# Patient Record
Sex: Male | Born: 1975 | Race: White | Hispanic: No | Marital: Single | Smoking: Current every day smoker
Health system: Southern US, Community
[De-identification: ages and names within clinical notes are randomized; demographics above are authoritative.]

## PROBLEM LIST (undated history)

## (undated) DIAGNOSIS — F319 Bipolar disorder, unspecified: Secondary | ICD-10-CM

## (undated) DIAGNOSIS — J45909 Unspecified asthma, uncomplicated: Secondary | ICD-10-CM

## (undated) DIAGNOSIS — C801 Malignant (primary) neoplasm, unspecified: Secondary | ICD-10-CM

## (undated) DIAGNOSIS — F1010 Alcohol abuse, uncomplicated: Secondary | ICD-10-CM

## (undated) DIAGNOSIS — R569 Unspecified convulsions: Secondary | ICD-10-CM

## (undated) HISTORY — PX: ANKLE FRACTURE SURGERY: SHX122

## (undated) HISTORY — PX: NOSE SURGERY: SHX723

---

## 2018-05-18 ENCOUNTER — Emergency Department (HOSPITAL_COMMUNITY)
Admission: EM | Admit: 2018-05-18 | Discharge: 2018-05-18 | Discharge status: Against Medical Advice | Payer: Self-pay | Attending: Emergency Medicine | Admitting: Emergency Medicine

## 2018-05-18 ENCOUNTER — Other Ambulatory Visit: Payer: Self-pay

## 2018-05-18 ENCOUNTER — Encounter (HOSPITAL_COMMUNITY): Payer: Self-pay | Admitting: *Deleted

## 2018-05-18 DIAGNOSIS — Y908 Blood alcohol level of 240 mg/100 ml or more: Secondary | ICD-10-CM | POA: Insufficient documentation

## 2018-05-18 DIAGNOSIS — F10920 Alcohol use, unspecified with intoxication, uncomplicated: Secondary | ICD-10-CM

## 2018-05-18 DIAGNOSIS — Z5321 Procedure and treatment not carried out due to patient leaving prior to being seen by health care provider: Secondary | ICD-10-CM | POA: Insufficient documentation

## 2018-05-18 DIAGNOSIS — F10120 Alcohol abuse with intoxication, uncomplicated: Secondary | ICD-10-CM | POA: Insufficient documentation

## 2018-05-18 HISTORY — DX: Unspecified asthma, uncomplicated: J45.909

## 2018-05-18 HISTORY — DX: Bipolar disorder, unspecified: F31.9

## 2018-05-18 LAB — SALICYLATE LEVEL: Salicylate Lvl: <7 mg/dL (ref 2.8–30.0)

## 2018-05-18 LAB — COMPREHENSIVE METABOLIC PANEL
ALT: 311 U/L — ABNORMAL HIGH (ref 0–44)
ANION GAP: 12 (ref 5–15)
AST: 194 U/L — AB (ref 15–41)
Albumin: 3.8 g/dL (ref 3.5–5.0)
Alkaline Phosphatase: 80 U/L (ref 38–126)
BILIRUBIN TOTAL: 0.4 mg/dL (ref 0.3–1.2)
BUN: 7 mg/dL (ref 6–20)
CO2: 30 mmol/L (ref 22–32)
Calcium: 9.2 mg/dL (ref 8.9–10.3)
Chloride: 102 mmol/L (ref 98–111)
Creatinine, Ser: 0.77 mg/dL (ref 0.61–1.24)
Glucose, Bld: 105 mg/dL — ABNORMAL HIGH (ref 70–99)
POTASSIUM: 2.5 mmol/L — AB (ref 3.5–5.1)
Sodium: 144 mmol/L (ref 135–145)
TOTAL PROTEIN: 7.1 g/dL (ref 6.5–8.1)

## 2018-05-18 LAB — ACETAMINOPHEN LEVEL: Acetaminophen (Tylenol), Serum: <10 ug/mL — ABNORMAL LOW (ref 10–30)

## 2018-05-18 LAB — CBC
HEMATOCRIT: 47.3 % (ref 39.0–52.0)
Hemoglobin: 16 g/dL (ref 13.0–17.0)
MCH: 31.2 pg (ref 26.0–34.0)
MCHC: 33.8 g/dL (ref 30.0–36.0)
MCV: 92.2 fL (ref 78.0–100.0)
Platelets: 162 10*3/uL (ref 150–400)
RBC: 5.13 MIL/uL (ref 4.22–5.81)
RDW: 14.5 % (ref 11.5–15.5)
WBC: 9.4 10*3/uL (ref 4.0–10.5)

## 2018-05-18 LAB — ETHANOL: ALCOHOL ETHYL (B): 402 mg/dL — AB (ref <10)

## 2018-05-18 MED ORDER — LORAZEPAM 2 MG/ML IJ SOLN: 0.0000 mg | Freq: Four times a day (QID) | INTRAMUSCULAR | Status: DC

## 2018-05-18 MED ORDER — LORAZEPAM 2 MG/ML IJ SOLN: 0.0000 mg | Freq: Two times a day (BID) | INTRAMUSCULAR | Status: DC

## 2018-05-18 MED ORDER — ALBUTEROL SULFATE (2.5 MG/3ML) 0.083% IN NEBU: 5.0000 mg | INHALATION_SOLUTION | Freq: Once | RESPIRATORY_TRACT | Status: DC

## 2018-05-18 MED ORDER — THIAMINE HCL 100 MG/ML IJ SOLN: 100.0000 mg | Freq: Every day | INTRAMUSCULAR | Status: DC

## 2018-05-18 MED ORDER — VITAMIN B-1 100 MG PO TABS: 100.0000 mg | ORAL_TABLET | Freq: Every day | ORAL | Status: DC

## 2018-05-18 MED ORDER — LORAZEPAM 1 MG PO TABS: 0.0000 mg | ORAL_TABLET | Freq: Four times a day (QID) | ORAL | Status: DC

## 2018-05-18 MED ORDER — LORAZEPAM 1 MG PO TABS: 0.0000 mg | ORAL_TABLET | Freq: Two times a day (BID) | ORAL | Status: DC

## 2018-05-18 NOTE — ED Triage Notes (Signed)
Pt states he was trying to kill himself with alcohol.  States found out his wife had been cheating on him a 8 months ago and he just couldn't take it anymore.  Has not been taking his trazadone and geodon for his bi-polar.  Pt appears to be intoxicated.

## 2018-05-18 NOTE — ED Provider Notes (Cosign Needed Addendum)
Patient placed in Quick Look pathway, seen and evaluated   Chief Complaint: Alcohol intoxication, suicidal ideation  HPI: Patient with history of bipolar disorder presents with complaint of suicidality, intoxication.  Patient has been drinking heavily over the past week to do stressors.  He has been off of his psych medications occluding Geodon and Trileptal.  He states that these typically make him feel better.  He complains of some abdominal pain and diarrhea.  No vomiting.  He states that he is trying to drink himself to death.  ROS:  Positive ROS: (+) Slurred speech, abdominal pain Negative ROS: (-) Fever, vomiting  Physical Exam:   Gen: No distress, grossly intoxicated  Neuro: Awake and Alert  Skin: Warm    Focused Exam: Heart tachycardia, nml S1,S2, no m/r/g; Lungs scattered wheezing; Abd soft, NT, no rebound or guarding; Ext 2+ pedal pulses bilaterally, no edema; neuro slurred speech; psych active suicidal ideation  BP 115/65 (BP Location: Right Arm)   Pulse (!) 108   Temp 98.7 F (37.1 C) (Oral)   Resp 18   SpO2 96%   Plan: We will check medical screening labs.  Patient will need TTS eval when intoxication improved.  Initiation of care has begun. The patient has been counseled on the process, plan, and necessity for staying for the completion/evaluation, and the remainder of the medical screening examination  Addend: Pt decided he is going to leave. He has no discreet plan of suicide and his main issue currently is that he is intoxicated. He voices that he wants help with alcohol but isn't willing to stay. No indications to IVC patient at current time.     Carlisle Cater, PA-C 05/18/18 1544    Carlisle Cater, PA-C 05/18/18 1654

## 2018-05-18 NOTE — ED Notes (Signed)
Lab called this RN with critical lab result of Alcohol level 402, Potassium level 2.5. Pt left ama prior to this call. Provider aware.

## 2018-05-18 NOTE — ED Notes (Signed)
Pt walked out of the room and stated he is leaving.  Attempted to have pt stay and pt refused.  Unable to physically restrain pt d/t fact that pt is not ivc'd.  PA is at pt side and is aware.

## 2018-06-15 ENCOUNTER — Emergency Department (HOSPITAL_COMMUNITY)
Admission: EM | Admit: 2018-06-15 | Discharge: 2018-06-16 | Disposition: A | Discharge status: Home / Self Care | Payer: Medicare Other | Attending: Emergency Medicine | Admitting: Emergency Medicine

## 2018-06-15 ENCOUNTER — Other Ambulatory Visit: Payer: Self-pay

## 2018-06-15 ENCOUNTER — Encounter (HOSPITAL_COMMUNITY): Payer: Self-pay

## 2018-06-15 DIAGNOSIS — R45851 Suicidal ideations: Secondary | ICD-10-CM

## 2018-06-15 DIAGNOSIS — F1014 Alcohol abuse with alcohol-induced mood disorder: Secondary | ICD-10-CM | POA: Insufficient documentation

## 2018-06-15 DIAGNOSIS — Y908 Blood alcohol level of 240 mg/100 ml or more: Secondary | ICD-10-CM | POA: Diagnosis not present

## 2018-06-15 DIAGNOSIS — F1010 Alcohol abuse, uncomplicated: Secondary | ICD-10-CM

## 2018-06-15 HISTORY — DX: Malignant (primary) neoplasm, unspecified: C80.1

## 2018-06-15 HISTORY — DX: Unspecified convulsions: R56.9

## 2018-06-15 HISTORY — DX: Alcohol abuse, uncomplicated: F10.10

## 2018-06-15 LAB — CBC
HEMATOCRIT: 48.5 % (ref 39.0–52.0)
HEMOGLOBIN: 17.1 g/dL — AB (ref 13.0–17.0)
MCH: 32.8 pg (ref 26.0–34.0)
MCHC: 35.3 g/dL (ref 30.0–36.0)
MCV: 93.1 fL (ref 78.0–100.0)
Platelets: 183 10*3/uL (ref 150–400)
RBC: 5.21 MIL/uL (ref 4.22–5.81)
RDW: 15 % (ref 11.5–15.5)
WBC: 9.1 10*3/uL (ref 4.0–10.5)

## 2018-06-15 LAB — COMPREHENSIVE METABOLIC PANEL
ALK PHOS: 92 U/L (ref 38–126)
ALT: 48 U/L — AB (ref 0–44)
AST: 61 U/L — AB (ref 15–41)
Albumin: 4.1 g/dL (ref 3.5–5.0)
Anion gap: 13 (ref 5–15)
BILIRUBIN TOTAL: 0.5 mg/dL (ref 0.3–1.2)
BUN: 6 mg/dL (ref 6–20)
CO2: 26 mmol/L (ref 22–32)
CREATININE: 0.86 mg/dL (ref 0.61–1.24)
Calcium: 8.9 mg/dL (ref 8.9–10.3)
Chloride: 104 mmol/L (ref 98–111)
GFR calc Af Amer: >60 mL/min (ref >60)
Glucose, Bld: 114 mg/dL — ABNORMAL HIGH (ref 70–99)
Potassium: 3.5 mmol/L (ref 3.5–5.1)
Sodium: 143 mmol/L (ref 135–145)
TOTAL PROTEIN: 7.7 g/dL (ref 6.5–8.1)

## 2018-06-15 LAB — ETHANOL: Alcohol, Ethyl (B): 310 mg/dL (ref <10)

## 2018-06-15 LAB — RAPID URINE DRUG SCREEN, HOSP PERFORMED
AMPHETAMINES: NOT DETECTED
BARBITURATES: NOT DETECTED
Benzodiazepines: NOT DETECTED
COCAINE: POSITIVE — AB
Opiates: NOT DETECTED
TETRAHYDROCANNABINOL: NOT DETECTED

## 2018-06-15 LAB — SALICYLATE LEVEL: Salicylate Lvl: <7 mg/dL (ref 2.8–30.0)

## 2018-06-15 LAB — ACETAMINOPHEN LEVEL: Acetaminophen (Tylenol), Serum: <10 ug/mL — ABNORMAL LOW (ref 10–30)

## 2018-06-15 NOTE — Progress Notes (Signed)
PT REFUSED TO SPEAK WITH THIS WRITER. UPON ENTERING THE ROOM TOLD TTS TO GET OUT AND THAT HE DID NOT FEEL LIKE TALKING BECAUSE HE HAS ANSWERED QUESTIONS 5 TIMES TODAY.   Lind Covert, MSW, LCSW Therapeutic Triage Specialist  [phone]

## 2018-06-15 NOTE — ED Provider Notes (Signed)
[facility] DEPT Provider Note   CSN: [id number] Arrival date & time: 06/15/18  1451     History   Chief Complaint Chief Complaint  Patient presents with  . Suicidal  . intoxicated  . detox    HPI Ronald Dickson is a 42 y.o. male.  42 yo M with a chief complaint of suicidal ideation.  The patient decided that he was going to may be harm himself by jumping off a bridge.  He went out drinking today.  He denies any recent change in things at home just feels overwhelmed.  He denies any medical complaint denies chest pain shortness of breath abdominal pain vomiting diarrhea.  The history is provided by the patient.  Illness  This is a new problem. The current episode started yesterday. The problem occurs constantly. The problem has not changed since onset.Pertinent negatives include no chest pain, no abdominal pain, no headaches and no shortness of breath. Nothing aggravates the symptoms. Nothing relieves the symptoms. He has tried nothing for the symptoms. The treatment provided no relief.    Past Medical History:  Diagnosis Date  . Alcohol abuse   . Asthma   . Bipolar 1 disorder ([name])   . Cancer ([name])   . Seizures ([name])     There are no active problems to display for this patient.   Past Surgical History:  Procedure Laterality Date  . ANKLE FRACTURE SURGERY    . NOSE SURGERY          Home Medications    Prior to Admission medications   Medication Sig Start Date End Date Taking? Authorizing Provider  Oxcarbazepine (TRILEPTAL) 300 MG tablet Take 300 mg by mouth 2 (two) times daily.   Yes [provider]  traZODone (DESYREL) 100 MG tablet Take 300 mg by mouth at bedtime.   Yes [provider]  ziprasidone (GEODON) 40 MG capsule Take 40 mg by mouth 2 (two) times daily with a meal.   Yes [provider]    Family History No family history on file.  Social History Social History   Tobacco Use  . Smoking  status: Current Every Day Smoker    Packs/day: 1.00    Types: Cigarettes  . Smokeless tobacco: Never Used  Substance Use Topics  . Alcohol use: Yes    Comment: daily- asmuch as I can. I want to stay passed out.  . Drug use: Yes    Types: Cocaine    Comment: Pataient states he has been using as much crack as possible     Allergies   Sulfa antibiotics   Review of Systems Review of Systems  Constitutional: Negative for chills and fever.  HENT: Negative for congestion and facial swelling.   Eyes: Negative for discharge and visual disturbance.  Respiratory: Negative for shortness of breath.   Cardiovascular: Negative for chest pain and palpitations.  Gastrointestinal: Negative for abdominal pain, diarrhea and vomiting.  Musculoskeletal: Negative for arthralgias and myalgias.  Skin: Negative for color change and rash.  Neurological: Negative for tremors, syncope and headaches.  Psychiatric/Behavioral: Positive for suicidal ideas. Negative for confusion and dysphoric mood.     Physical Exam Updated Vital Signs BP 114/84 (BP Location: Right Arm)   Pulse (!) 111   Temp 98.2 F (36.8 C) (Oral)   Resp 16   Ht 5\' 8"  (1.727 m)   Wt 83.9 kg   SpO2 94%   BMI 28.13 kg/m   Physical Exam  Constitutional: He  is oriented to person, place, and time. He appears well-developed and well-nourished.  HENT:  Head: Normocephalic and atraumatic.  Eyes: Pupils are equal, round, and reactive to light. EOM are normal.  Neck: Normal range of motion. Neck supple. No JVD present.  Cardiovascular: Normal rate and regular rhythm. Exam reveals no gallop and no friction rub.  No murmur heard. Pulmonary/Chest: No respiratory distress. He has no wheezes.  Abdominal: He exhibits no distension. There is no rebound and no guarding.  Musculoskeletal: Normal range of motion.  Neurological: He is alert and oriented to person, place, and time.  Skin: No rash noted. No pallor.  Psychiatric: He has a normal  mood and affect. His behavior is normal.  Nursing note and vitals reviewed.    ED Treatments / Results  Labs (all labs ordered are listed, but only abnormal results are displayed) Labs Reviewed  COMPREHENSIVE METABOLIC PANEL - Abnormal; Notable for the following components:      Result Value   Glucose, Bld 114 (*)    AST 61 (*)    ALT 48 (*)    All other components within normal limits  ETHANOL - Abnormal; Notable for the following components:   Alcohol, Ethyl (B) 310 (*)    All other components within normal limits  ACETAMINOPHEN LEVEL - Abnormal; Notable for the following components:   Acetaminophen (Tylenol), Serum <10 (*)    All other components within normal limits  CBC - Abnormal; Notable for the following components:   Hemoglobin 17.1 (*)    All other components within normal limits  RAPID URINE DRUG SCREEN, HOSP PERFORMED - Abnormal; Notable for the following components:   Cocaine POSITIVE (*)    All other components within normal limits  SALICYLATE LEVEL    EKG None  Radiology No results found.  Procedures Procedures (including critical care time)  Medications Ordered in ED Medications - No data to display   Initial Impression / Assessment and Plan / ED Course  I have reviewed the triage vital signs and the nursing notes.  Pertinent labs & imaging results that were available during my care of the patient were reviewed by me and considered in my medical decision making (see chart for details).     42 yo M with a chief complaint of suicidal ideation.  I feel he is medically clear.  TTS evaluation.  Patient refusing TTS eval, will likely be seen in morning.   The patients results and plan were reviewed and discussed.   Any x-rays performed were independently reviewed by myself.   Differential diagnosis were considered with the presenting HPI.  Medications - No data to display  Vitals:   06/15/18 1519 06/15/18 1540 06/15/18 1819 06/15/18 2132  BP:  133/89 (!) 114/54 128/68 114/84  Pulse: 97 67 83 (!) 111  Resp:  16 18 16   Temp:  98.2 F (36.8 C) 98.1 F (36.7 C) 98.2 F (36.8 C)  TempSrc:  Oral Oral Oral  SpO2:  96% 93% 94%  Weight:      Height:        Final diagnoses:  Suicidal ideation  Alcohol abuse      Final Clinical Impressions(s) / ED Diagnoses   Final diagnoses:  Suicidal ideation  Alcohol abuse    ED Discharge Orders    None       Deno Etienne, DO 06/15/18 2235

## 2018-06-15 NOTE — ED Notes (Signed)
BAC reported as 75  Dr Tyrone Nine made aware

## 2018-06-15 NOTE — ED Triage Notes (Addendum)
Patient was brought in by [employer]. Patient c/o feeling suicidal with a plan to jump off of a bridge. Patient has been drinking alcohol. Patient states he has been off of his meds x 2 [place].   Patient takes Geodon 40 mg bid, Trileptal 300 mg tid, and Trazadone 350 mg every hs  Patient states he is from [place].  Patient states he drinks as much as possible to stay passed out and states he uses as much crack as possbile. Patient states he has seizures when he goes through detox

## 2018-06-15 NOTE — ED Notes (Signed)
Pharmacy at bedside getting medication history.

## 2018-06-15 NOTE — ED Notes (Addendum)
Patient moved from room 26 to 41.  Alert and oriented but drowsy.  Patient admits he has not taken his medication for several [place] and has been drinking ETOH and doing cocaine.  He says he has a diagnosis of bipolar.  When asked if he is suicidal, patient replied he doesn't know what he is right now.  Patient oriented to the unit, advised of 15 minute checks, and video monitoring.  Patient in no acute distress.  Reports he knows the medications he is supposed to be taking and started to list them off to this Probation officer.  Pharmacy called to obtain prior medication list.

## 2018-06-15 NOTE — Progress Notes (Signed)
Patient was asleep and unresponsive when TTS went to assess. Will assess when he wakes up.

## 2018-06-15 NOTE — ED Notes (Signed)
Bed: [facility] Expected date:  Expected time:  Means of arrival:  Comments: 

## 2018-06-16 DIAGNOSIS — F1014 Alcohol abuse with alcohol-induced mood disorder: Secondary | ICD-10-CM

## 2018-06-16 MED ORDER — TRAZODONE HCL 50 MG PO TABS: 50.0000 mg | ORAL_TABLET | Freq: Every evening | ORAL | Status: DC | PRN

## 2018-06-16 MED ORDER — OXCARBAZEPINE 300 MG PO TABS: 300.0000 mg | ORAL_TABLET | Freq: Two times a day (BID) | ORAL | Status: DC

## 2018-06-16 MED ORDER — OXCARBAZEPINE 300 MG PO TABS
300.0000 mg | ORAL_TABLET | Freq: Two times a day (BID) | ORAL | Status: DC
  Administered 2018-06-16: 300 mg via ORAL
  Filled 2018-06-16: qty 1

## 2018-06-16 NOTE — Patient Outreach (Signed)
[facility] met with the patient to provide help with substance use recovery support and help with recovery resources. Patient states that the only reason he has been drinking alcohol and using cocaine is to help with the symptoms of his Bi-polar disorder and not having access to medications. Information for [facility] will be provided in discharge instructions to help with medications. Patient does not want help with substance use treatment placement at this time. [facility]PSS still provided [facility] contact information for the patient. [facility] strongly encouraged the patient to follow up with [facility] for recovery support and help with recovery resources after discharge from the [facility] if needed.

## 2018-06-16 NOTE — Discharge Instructions (Signed)
For your Mental Needs, please follow up at:   [facility] [address], [place] [address] [phone]   For assistance with homeless shelters and identification needs, please follow up at:  [facility] [place], [place] [address] [phone]
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
## 2018-06-16 NOTE — Progress Notes (Signed)
Per Patriciaann Clan, PA pt meets criteria for inpt treatment. EDP Nehemiah Settle, PA and pt's nurse Bethena Roys, RN has been advised. TTS to seek placement.  Lind Covert, MSW, LCSW Therapeutic Triage Specialist  [phone]

## 2018-06-16 NOTE — BH Assessment (Addendum)
Assessment Note  Ronald Dickson is an 42 y.o. male who presents to the ED voluntarily. Pt initially refused to engage with TTS and stated in an angry tone "I'm tired of answering questions. They keep asking me the same stuff." TTS asked the pt to identify the stressors leading him to feel suicidal and pt stated "I already told them that." After much probing, pt eventually states that he recently moved here from [place] and he does not have an ID. Pt states he has the potential to work making $40 an hour but since he does not have an ID, he's not able to work. Pt states he has been to the [facility] and other places for help with obtaining his ID but he needs a birth certificate. Pt states he cannot afford to purchase his birth certificate, therefore he's not able to get his ID. Pt reported during triage that he has a plan to jump off of a bridge. Pt's current BAL is 310 and he is also positive for cocaine. Pt states if he does not get back on his medication he is going to kill himself.   Per Patriciaann Clan, PA pt meets criteria for inpt treatment. EDP Nehemiah Settle, PA and pt's nurse Bethena Roys, RN has been advised. TTS to seek placement.  Diagnosis: Substance induced mood disorder; Alcohol use disorder, severe; Cocaine use disorder, severe  Past Medical History:  Past Medical History:  Diagnosis Date  . Alcohol abuse   . Asthma   . Bipolar 1 disorder ([place])   . Cancer ([place])   . Seizures ([place])     Past Surgical History:  Procedure Laterality Date  . ANKLE FRACTURE SURGERY    . NOSE SURGERY      Family History: No family history on file.  Social History:  reports that he has been smoking cigarettes. He has been smoking about 1.00 pack per day. He has never used smokeless tobacco. He reports that he drinks alcohol. He reports that he has current or past drug history. Drug: Cocaine.  Additional Social History:  Alcohol / Drug Use Pain Medications: See MAR Prescriptions: See MAR Over the Counter: See  MAR History of alcohol / drug use?: Yes Substance #1 Name of Substance 1: Alcohol 1 - Age of First Use: unknown 1 - Amount (size/oz): BAL 310 1 - Frequency: unknown 1 - Duration: ongoing 1 - Last Use / Amount: 06/15/18 Substance #2 Name of Substance 2: Cocaine 2 - Age of First Use: unknown 2 - Amount (size/oz): varies 2 - Frequency: unknown 2 - Duration: ongoing 2 - Last Use / Amount: labs positive on arrival to ED  CIWA: CIWA-Ar BP: 135/82 Pulse Rate: 94 Nausea and Vomiting: 3 Tactile Disturbances: mild itching, pins and needles, burning or numbness Tremor: no tremor Auditory Disturbances: not present Paroxysmal Sweats: no sweat visible Visual Disturbances: not present Anxiety: moderately anxious, or guarded, so anxiety is inferred Headache, Fullness in Head: moderate Agitation: two Orientation and Clouding of Sensorium: oriented and can do serial additions CIWA-Ar Total: 14 COWS:    Allergies:  Allergies  Allergen Reactions  . Sulfa Antibiotics     Home Medications:  (Not in a hospital admission)  OB/GYN Status:  No LMP for male patient.  General Assessment Data Assessment unable to be completed: Yes Reason for not completing assessment: PT REFUSED TO SPEAK WITH THIS WRITER. UPON ENTERING THE ROOM TOLD TTS TO GET OUT AND THAT HE DID NOT FEEL LIKE TALKING BECAUSE HE HAS ANSWERED QUESTIONS 5 TIMES  TODAY.  [facility] TTS Assessment: In system Is this a Tele or Face-to-Face Assessment?: Face-to-Face Is this an Initial Assessment or a Re-assessment for this encounter?: Initial Assessment Patient Accompanied by:: (alone) Language Other than English: No Living Arrangements: Homeless/Shelter What gender do you identify as?: Male Marital status: (UTA) Pregnancy Status: No Living Arrangements: Alone Can pt return to current living arrangement?: Yes Admission Status: Voluntary Is patient capable of signing voluntary admission?: Yes Referral  Source: Self/Family/Friend Insurance type: NONE     Crisis Care Plan Living Arrangements: Alone Name of Psychiatrist: [place] Name of Therapist: UTA  Education Status Is patient currently in school?: No Is the patient employed, unemployed or receiving disability?: Unemployed  Risk to self with the past 6 months Suicidal Ideation: Yes-Currently Present Has patient been a risk to self within the past 6 months prior to admission? : Yes Suicidal Intent: No Has patient had any suicidal intent within the past 6 months prior to admission? : No Is patient at risk for suicide?: Yes Suicidal Plan?: Yes-Currently Present Has patient had any suicidal plan within the past 6 months prior to admission? : Yes Specify Current Suicidal Plan: PT TOLD TRIAGE THAT HE HAS A PLAN TO JUMP OFF OF A BRIDGE  Access to Means: Yes Specify Access to Suicidal Means: PT HAS ACCESS TO [place]  What has been your use of drugs/alcohol within the last 12 months?: ALCOHOL, COCAINE Previous Attempts/Gestures: (UTA) Triggers for Past Attempts: Unknown Intentional Self Injurious Behavior: None Family Suicide History: No Recent stressful life event(s): Job Loss, Financial Problems, Other (Comment)(MOVED TO [place], NO ID, SUBSTANCE ABUSE ) Persecutory voices/beliefs?: No Depression: Yes Depression Symptoms: Feeling angry/irritable Substance abuse history and/or treatment for substance abuse?: Yes Suicide prevention information given to non-admitted patients: Not applicable  Risk to Others within the past 6 months Homicidal Ideation: No Does patient have any lifetime risk of violence toward others beyond the six months prior to admission? : No Thoughts of Harm to Others: No Current Homicidal Intent: No Current Homicidal Plan: No Access to Homicidal Means: No History of harm to others?: No Assessment of Violence: None Noted Does patient have access to weapons?: (UTA) Criminal Charges Pending?: (UTA) Does patient have a  court date: (UTA) Is patient on probation?: Unknown  Psychosis Hallucinations: (UTA) Delusions: (UTA)  Mental Status Report Appearance/Hygiene: Disheveled, In scrubs Eye Contact: Poor Motor Activity: Freedom of movement Speech: Aggressive Level of Consciousness: Irritable Mood: Angry Affect: Angry, Irritable Anxiety Level: None Thought Processes: Coherent, Relevant Judgement: Impaired Orientation: Person, Place, Time Obsessive Compulsive Thoughts/Behaviors: None  Cognitive Functioning Concentration: Normal Memory: Remote Intact, Recent Intact Is patient IDD: No Insight: Poor Impulse Control: Poor Appetite: Good Have you had any weight changes? : No Change Sleep: Unable to Assess Vegetative Symptoms: None  ADLScreening [facility] Assessment Services) Patient's cognitive ability adequate to safely complete daily activities?: Yes Patient able to express need for assistance with ADLs?: Yes Independently performs ADLs?: Yes (appropriate for developmental age)  Prior Inpatient Therapy Prior Inpatient Therapy: (UNKNOWN)  Prior Outpatient Therapy Prior Outpatient Therapy: (UNKNOWN) Does patient have an ACCT team?: No Does patient have Intensive In-House Services?  : No Does patient have [facility] services? : No Does patient have [facility] services?: No  ADL Screening (condition at time of admission) Patient's cognitive ability adequate to safely complete daily activities?: Yes Is the patient deaf or have difficulty hearing?: No Does the patient have difficulty seeing, even when wearing glasses/contacts?: No Does the patient have  difficulty concentrating, remembering, or making decisions?: No Patient able to express need for assistance with ADLs?: Yes Does the patient have difficulty dressing or bathing?: No Independently performs ADLs?: Yes (appropriate for developmental age) Does the patient have difficulty walking or climbing stairs?: No Weakness of Legs: None Weakness of  Arms/Hands: None  Home Assistive Devices/Equipment Home Assistive Devices/Equipment: None    Abuse/Neglect Assessment (Assessment to be complete while patient is alone) Abuse/Neglect Assessment Can Be Completed: Unable to assess, patient is non-responsive or altered mental status     Advance Directives (For Healthcare) Does Patient Have a Medical Advance Directive?: No Would patient like information on creating a medical advance directive?: No - Patient declined          Disposition: Per Patriciaann Clan, PA pt meets criteria for inpt treatment. EDP Nehemiah Settle, PA and pt's nurse Bethena Roys, RN has been advised. TTS to seek placement.  Disposition Initial Assessment Completed for this Encounter: Yes Disposition of Patient: Admit Type of inpatient treatment program: Adult(per Patriciaann Clan, PA) Patient refused recommended treatment: No  On Site Evaluation by:   Reviewed with Physician:    Lyanne Co 06/16/2018 6:17 AM

## 2018-06-21 ENCOUNTER — Encounter (HOSPITAL_COMMUNITY): Payer: Self-pay

## 2018-06-21 ENCOUNTER — Emergency Department (HOSPITAL_COMMUNITY)
Admission: EM | Admit: 2018-06-21 | Discharge: 2018-06-21 | Disposition: A | Discharge status: Home / Self Care | Payer: Medicare Other | Attending: Emergency Medicine | Admitting: Emergency Medicine

## 2018-06-21 ENCOUNTER — Other Ambulatory Visit: Payer: Self-pay

## 2018-06-21 DIAGNOSIS — Z79899 Other long term (current) drug therapy: Secondary | ICD-10-CM | POA: Diagnosis not present

## 2018-06-21 DIAGNOSIS — F250 Schizoaffective disorder, bipolar type: Secondary | ICD-10-CM | POA: Insufficient documentation

## 2018-06-21 DIAGNOSIS — F1010 Alcohol abuse, uncomplicated: Secondary | ICD-10-CM

## 2018-06-21 DIAGNOSIS — R45851 Suicidal ideations: Secondary | ICD-10-CM | POA: Diagnosis not present

## 2018-06-21 DIAGNOSIS — F1490 Cocaine use, unspecified, uncomplicated: Secondary | ICD-10-CM | POA: Insufficient documentation

## 2018-06-21 DIAGNOSIS — Y907 Blood alcohol level of 200-239 mg/100 ml: Secondary | ICD-10-CM | POA: Diagnosis not present

## 2018-06-21 DIAGNOSIS — F10920 Alcohol use, unspecified with intoxication, uncomplicated: Secondary | ICD-10-CM | POA: Diagnosis not present

## 2018-06-21 LAB — CBC
HEMATOCRIT: 46.8 % (ref 39.0–52.0)
Hemoglobin: 16 g/dL (ref 13.0–17.0)
MCH: 32.4 pg (ref 26.0–34.0)
MCHC: 34.2 g/dL (ref 30.0–36.0)
MCV: 94.7 fL (ref 78.0–100.0)
PLATELETS: 178 10*3/uL (ref 150–400)
RBC: 4.94 MIL/uL (ref 4.22–5.81)
RDW: 14.6 % (ref 11.5–15.5)
WBC: 5.2 10*3/uL (ref 4.0–10.5)

## 2018-06-21 LAB — COMPREHENSIVE METABOLIC PANEL
ALK PHOS: 77 U/L (ref 38–126)
ALT: 115 U/L — AB (ref 0–44)
AST: 104 U/L — AB (ref 15–41)
Albumin: 3.7 g/dL (ref 3.5–5.0)
Anion gap: 14 (ref 5–15)
BUN: 7 mg/dL (ref 6–20)
CALCIUM: 8.8 mg/dL — AB (ref 8.9–10.3)
CHLORIDE: 106 mmol/L (ref 98–111)
CO2: 26 mmol/L (ref 22–32)
Creatinine, Ser: 0.71 mg/dL (ref 0.61–1.24)
Glucose, Bld: 110 mg/dL — ABNORMAL HIGH (ref 70–99)
Potassium: 3.3 mmol/L — ABNORMAL LOW (ref 3.5–5.1)
Sodium: 146 mmol/L — ABNORMAL HIGH (ref 135–145)
TOTAL PROTEIN: 7.3 g/dL (ref 6.5–8.1)
Total Bilirubin: 0.3 mg/dL (ref 0.3–1.2)

## 2018-06-21 LAB — SALICYLATE LEVEL

## 2018-06-21 LAB — ETHANOL: Alcohol, Ethyl (B): 237 mg/dL — ABNORMAL HIGH (ref <10)

## 2018-06-21 LAB — ACETAMINOPHEN LEVEL

## 2018-06-21 NOTE — ED Provider Notes (Signed)
[facility] DEPT Provider Note   CSN: [id number] Arrival date & time: 06/21/18  0125     History   Chief Complaint Chief Complaint  Patient presents with  . Suicidal  . Alcohol Intoxication    HPI Ronald Dickson is a 42 y.o. male.  The history is provided by the patient and medical records. No language interpreter was used.  Alcohol Intoxication    Ronald Dickson is a 42 y.o. male  with a PMH of bipolar disorder, seizures, asthma, etoh abuse who presents to the Emergency Department for suicidal thoughts.  Per nursing staff, he told multiple people when checking and that he wanted to blow his brains out.  During my evaluation, I awakened him from sleep.  He yelled several profanities and told me to leave him alone.  I asked him several times while he was in the emergency department today and he kept telling me about how he needs an ID card and needs help with getting his ID card figured out.  I asked him if he had thoughts about wanting to hurt himself and he said "well you just woke me up and I haven't had time to get two thoughts together." I asked him again later in the interview and he told me to quit bothering him and asking so many questions.    Past Medical History:  Diagnosis Date  . Alcohol abuse   . Asthma   . Bipolar 1 disorder ([place])   . Cancer ([place])   . Seizures [facility])     Patient Active Problem List   Diagnosis Date Noted  . Alcohol abuse with alcohol-induced mood disorder ([place]) 06/16/2018    Past Surgical History:  Procedure Laterality Date  . ANKLE FRACTURE SURGERY    . NOSE SURGERY          Home Medications    Prior to Admission medications   Medication Sig Start Date End Date Taking? Authorizing Provider  Oxcarbazepine (TRILEPTAL) 300 MG tablet Take 300 mg by mouth 2 (two) times daily.   Yes [provider]  traZODone (DESYREL) 100 MG tablet Take 300 mg by mouth at bedtime.   Yes [provider]    ziprasidone (GEODON) 40 MG capsule Take 40 mg by mouth 2 (two) times daily with a meal.   Yes [provider]    Family History History reviewed. No pertinent family history.  Social History Social History   Tobacco Use  . Smoking status: Current Every Day Smoker    Packs/day: 1.00    Types: Cigarettes  . Smokeless tobacco: Never Used  Substance Use Topics  . Alcohol use: Yes    Comment: daily- asmuch as I can. I want to stay passed out.  . Drug use: Yes    Types: Cocaine    Comment: Pataient states he has been using as much crack as possible     Allergies   Sulfa antibiotics   Review of Systems Review of Systems  Reason unable to perform ROS: Patient cooperation.  Psychiatric/Behavioral: Positive for suicidal ideas.     Physical Exam Updated Vital Signs BP (!) 94/53 (BP Location: Right Arm)   Pulse 83   Resp 16   SpO2 96%   Physical Exam  Constitutional: He is oriented to person, place, and time. He appears well-developed and well-nourished. No distress.  HENT:  Head: Normocephalic and atraumatic.  Neck: Neck supple.  Cardiovascular: Normal rate, regular rhythm and normal heart sounds.  No  murmur heard. Pulmonary/Chest: Effort normal and breath sounds normal. No respiratory distress.  Musculoskeletal: Normal range of motion.  Neurological: He is alert and oriented to person, place, and time.  Skin: Skin is warm and dry.  Nursing note and vitals reviewed.    ED Treatments / Results  Labs (all labs ordered are listed, but only abnormal results are displayed) Labs Reviewed  COMPREHENSIVE METABOLIC PANEL - Abnormal; Notable for the following components:      Result Value   Sodium 146 (*)    Potassium 3.3 (*)    Glucose, Bld 110 (*)    Calcium 8.8 (*)    AST 104 (*)    ALT 115 (*)    All other components within normal limits  ETHANOL - Abnormal; Notable for the following components:   Alcohol, Ethyl (B) 237 (*)    All other components  within normal limits  ACETAMINOPHEN LEVEL - Abnormal; Notable for the following components:   Acetaminophen (Tylenol), Serum <10 (*)    All other components within normal limits  SALICYLATE LEVEL  CBC  RAPID URINE DRUG SCREEN, HOSP PERFORMED    EKG None  Radiology No results found.  Procedures Procedures (including critical care time)  Medications Ordered in ED Medications - No data to display   Initial Impression / Assessment and Plan / ED Course  I have reviewed the triage vital signs and the nursing notes.  Pertinent labs & imaging results that were available during my care of the patient were reviewed by me and considered in my medical decision making (see chart for details).    Ronald Dickson is a 42 y.o. male who presents to ED after telling several people that he wanted to blow his brains out. He denies any SI to me, stating that he needs to get an ID in the morning and needs a place to stay until he can get this sorted out. Labs reviewed with ETOH of 237. Baseline liver enzymes. Given SI in triage, TTS was consulted who does not feel that patient meets criteria. Recommends discharge. Patient discharged in satisfactory condition.    Final Clinical Impressions(s) / ED Diagnoses   Final diagnoses:  Alcohol abuse    ED Discharge Orders    None       Ward, Ozella Almond, PA-C 06/21/18 1749    Veryl Speak, MD 06/21/18 [phone]

## 2018-06-21 NOTE — ED Notes (Signed)
Bed: WTR5 Expected date:  Expected time:  Means of arrival:  Comments: 
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
## 2018-06-21 NOTE — ED Notes (Signed)
Patient became irate at discharge due to [place] "rushing" him out of here. Patient refused papers and vital signs at discharge.

## 2018-06-21 NOTE — ED Triage Notes (Signed)
Pt reports SI. He states that he wants to blow his brains out. Hx of bipolar and schizophrenia.

## 2018-06-21 NOTE — Discharge Instructions (Signed)
Return to ER for new or worsening symptoms, any additional concerns.

## 2018-06-21 NOTE — Progress Notes (Signed)
Pt psych cleared. Denies SI, HI, AVH to this Probation officer. Pt asked why people keep coming into his room and waking him up when he is sleeping. Pt is homeless. Recommended for d/c. Lindon Romp, NP, EDP Ward, Ozella Almond, PA-C and Triage nurse in agreement.   Lind Covert, MSW, LCSW Therapeutic Triage Specialist  [phone]

## 2018-07-18 ENCOUNTER — Emergency Department (HOSPITAL_COMMUNITY): Payer: Medicare Other

## 2018-07-18 ENCOUNTER — Encounter (HOSPITAL_COMMUNITY): Payer: Self-pay | Admitting: Emergency Medicine

## 2018-07-18 ENCOUNTER — Emergency Department (HOSPITAL_COMMUNITY)
Admission: EM | Admit: 2018-07-18 | Discharge: 2018-07-18 | Disposition: A | Discharge status: Home / Self Care | Payer: Medicare Other | Attending: Emergency Medicine | Admitting: Emergency Medicine

## 2018-07-18 DIAGNOSIS — Z79899 Other long term (current) drug therapy: Secondary | ICD-10-CM | POA: Insufficient documentation

## 2018-07-18 DIAGNOSIS — F17210 Nicotine dependence, cigarettes, uncomplicated: Secondary | ICD-10-CM | POA: Diagnosis not present

## 2018-07-18 DIAGNOSIS — E860 Dehydration: Secondary | ICD-10-CM | POA: Diagnosis not present

## 2018-07-18 DIAGNOSIS — J45909 Unspecified asthma, uncomplicated: Secondary | ICD-10-CM | POA: Insufficient documentation

## 2018-07-18 DIAGNOSIS — J40 Bronchitis, not specified as acute or chronic: Secondary | ICD-10-CM | POA: Diagnosis not present

## 2018-07-18 DIAGNOSIS — R197 Diarrhea, unspecified: Secondary | ICD-10-CM

## 2018-07-18 DIAGNOSIS — R1084 Generalized abdominal pain: Secondary | ICD-10-CM | POA: Diagnosis present

## 2018-07-18 DIAGNOSIS — R112 Nausea with vomiting, unspecified: Secondary | ICD-10-CM | POA: Insufficient documentation

## 2018-07-18 LAB — CBC WITH DIFFERENTIAL/PLATELET
Abs Immature Granulocytes: 0.06 10*3/uL (ref 0.00–0.07)
BASOS ABS: 0.1 10*3/uL (ref 0.0–0.1)
BASOS PCT: 0 %
EOS ABS: 0 10*3/uL (ref 0.0–0.5)
EOS PCT: 0 %
HCT: 48.9 % (ref 39.0–52.0)
HEMOGLOBIN: 16.8 g/dL (ref 13.0–17.0)
Immature Granulocytes: 1 %
LYMPHS PCT: 23 %
Lymphs Abs: 2.6 10*3/uL (ref 0.7–4.0)
MCH: 31.5 pg (ref 26.0–34.0)
MCHC: 34.4 g/dL (ref 30.0–36.0)
MCV: 91.7 fL (ref 80.0–100.0)
MONO ABS: 1 10*3/uL (ref 0.1–1.0)
Monocytes Relative: 9 %
NRBC: 0 % (ref 0.0–0.2)
Neutro Abs: 7.9 10*3/uL — ABNORMAL HIGH (ref 1.7–7.7)
Neutrophils Relative %: 67 %
Platelets: 163 10*3/uL (ref 150–400)
RBC: 5.33 MIL/uL (ref 4.22–5.81)
RDW: 13.6 % (ref 11.5–15.5)
WBC: 11.6 10*3/uL — AB (ref 4.0–10.5)

## 2018-07-18 LAB — COMPREHENSIVE METABOLIC PANEL
ALT: 34 U/L (ref 0–44)
AST: 54 U/L — AB (ref 15–41)
Albumin: 3.7 g/dL (ref 3.5–5.0)
Alkaline Phosphatase: 108 U/L (ref 38–126)
Anion gap: 18 — ABNORMAL HIGH (ref 5–15)
BILIRUBIN TOTAL: 0.8 mg/dL (ref 0.3–1.2)
BUN: 7 mg/dL (ref 6–20)
CALCIUM: 9.3 mg/dL (ref 8.9–10.3)
CHLORIDE: 91 mmol/L — AB (ref 98–111)
CO2: 26 mmol/L (ref 22–32)
CREATININE: 0.69 mg/dL (ref 0.61–1.24)
Glucose, Bld: 96 mg/dL (ref 70–99)
Potassium: 3.2 mmol/L — ABNORMAL LOW (ref 3.5–5.1)
Sodium: 135 mmol/L (ref 135–145)
Total Protein: 7.5 g/dL (ref 6.5–8.1)

## 2018-07-18 LAB — I-STAT TROPONIN, ED: TROPONIN I, POC: 0 ng/mL (ref 0.00–0.08)

## 2018-07-18 LAB — I-STAT CG4 LACTIC ACID, ED: Lactic Acid, Venous: 2.4 mmol/L (ref 0.5–1.9)

## 2018-07-18 LAB — LIPASE, BLOOD: Lipase: 59 U/L — ABNORMAL HIGH (ref 11–51)

## 2018-07-18 LAB — ETHANOL: ALCOHOL ETHYL (B): 295 mg/dL — AB (ref <10)

## 2018-07-18 MED ORDER — NICOTINE 21 MG/24HR TD PT24
21.0000 mg | MEDICATED_PATCH | Freq: Once | TRANSDERMAL | Status: DC
  Administered 2018-07-18: 21 mg via TRANSDERMAL
  Filled 2018-07-18: qty 1

## 2018-07-18 MED ORDER — PREDNISONE 20 MG PO TABS: 40.0000 mg | ORAL_TABLET | Freq: Every day | ORAL | 0 refills | Status: AC

## 2018-07-18 MED ORDER — AZITHROMYCIN 250 MG PO TABS: 250.0000 mg | ORAL_TABLET | Freq: Every day | ORAL | 0 refills | Status: DC

## 2018-07-18 MED ORDER — ALBUTEROL SULFATE (2.5 MG/3ML) 0.083% IN NEBU: 5.0000 mg | INHALATION_SOLUTION | Freq: Once | RESPIRATORY_TRACT | Status: DC

## 2018-07-18 MED ORDER — ALBUTEROL SULFATE HFA 108 (90 BASE) MCG/ACT IN AERS
2.0000 | INHALATION_SPRAY | RESPIRATORY_TRACT | Status: DC | PRN
  Administered 2018-07-18: 2 via RESPIRATORY_TRACT
  Filled 2018-07-18: qty 6.7

## 2018-07-18 MED ORDER — ONDANSETRON HCL 4 MG/2ML IJ SOLN
4.0000 mg | Freq: Once | INTRAMUSCULAR | Status: AC
  Administered 2018-07-18: 4 mg via INTRAVENOUS
  Filled 2018-07-18: qty 2

## 2018-07-18 MED ORDER — SODIUM CHLORIDE 0.9 % IV SOLN: INTRAVENOUS | Status: DC

## 2018-07-18 MED ORDER — IPRATROPIUM BROMIDE 0.02 % IN SOLN
0.5000 mg | Freq: Once | RESPIRATORY_TRACT | Status: AC
  Administered 2018-07-18: 0.5 mg via RESPIRATORY_TRACT
  Filled 2018-07-18: qty 2.5

## 2018-07-18 MED ORDER — PREDNISONE 20 MG PO TABS
60.0000 mg | ORAL_TABLET | Freq: Once | ORAL | Status: AC
  Administered 2018-07-18: 60 mg via ORAL
  Filled 2018-07-18: qty 3

## 2018-07-18 MED ORDER — SODIUM CHLORIDE 0.9 % IV BOLUS
1000.0000 mL | Freq: Once | INTRAVENOUS | Status: AC
  Administered 2018-07-18: 1000 mL via INTRAVENOUS

## 2018-07-18 NOTE — ED Triage Notes (Addendum)
Patient reports upper abdominal pain with emesis and diarrhea onset last week , denies fever or chills , pt. added fatigue /gen. body aches . Patient evaluated by PA at triage .

## 2018-07-18 NOTE — ED Notes (Signed)
Patient verbalizes understanding of discharge instructions. Opportunity for questioning and answers were provided. Armband removed by staff, pt discharged from ED to home ambulatory.  

## 2018-07-18 NOTE — ED Provider Notes (Addendum)
Patient placed in Quick Look pathway, seen and evaluated   Chief Complaint: body aches, shortness of breath, chest pain.   HPI:   Patient reports that for 10 days he has had chest pain, shortness of breath, diaphoresis, muscle aches, body aches.  Is requesting to be allowed to go outside to smoke a cigarette.  He walked out to the lobby to smoke in no distress.  He states that primarily he wants to know if he has pneumonia or the flu, and is requesting a steroid shot and antibiotics.  He states that he has been attempting to keep drinking to avoid withdrawal, however is vomiting frequently.   After patient left to smoke, despite me telling him that was not allowed, his visitor tells me that he is bipolar, has not been taking his geodon or psych meds.    ROS: Nausea, vomiting, and diarrhea.  (one)  Physical Exam:   Gen: No distress  Neuro: Awake and Alert  Skin: Warm    Focused Exam: Patient is awake and alert.     Initiation of care has begun. The patient has been counseled on the process, plan, and necessity for staying for the completion/evaluation, and the remainder of the medical screening examination     Ollen Gross 07/18/18 2045    Deno Etienne, DO 07/18/18 2259

## 2018-07-18 NOTE — ED Provider Notes (Signed)
[facility] EMERGENCY DEPARTMENT Provider Note   CSN: [id number] Arrival date & time: 07/18/18  2026     History   Chief Complaint Chief Complaint  Patient presents with  . Abdominal Pain    Emesis/Diarrhea    HPI Ronald Dickson is a 42 y.o. male.  The history is provided by the patient and the spouse.  Abdominal Pain   This is a new problem. Episode onset: 10 days. The problem occurs constantly. The problem has not changed since onset.The pain is associated with eating. The pain is located in the generalized abdominal region. The quality of the pain is aching. The pain is at a severity of 5/10. The pain is moderate. Associated symptoms include anorexia, fever, diarrhea, nausea and vomiting. Pertinent negatives include constipation and dysuria. Associated symptoms comments: Cough productive of green/yellow sputum and SOB that has been present over the last 1 week as well.  Patient states that he normally would drink a pint of liquor a day but over the last 2 [place] has been doing just sips of beer but today he was even throwing that up.  He does smoke cigarettes heavily but denies any recent drug use.. The symptoms are aggravated by eating. Nothing relieves the symptoms. Past medical history comments: Chronic bronchitis, asthma, alcohol abuse.    Past Medical History:  Diagnosis Date  . Alcohol abuse   . Asthma   . Bipolar 1 disorder ([place])   . Cancer ([place])   . Seizures [facility])     Patient Active Problem List   Diagnosis Date Noted  . Alcohol abuse with alcohol-induced mood disorder ([place]) 06/16/2018    Past Surgical History:  Procedure Laterality Date  . ANKLE FRACTURE SURGERY    . NOSE SURGERY          Home Medications    Prior to Admission medications   Medication Sig Start Date End Date Taking? Authorizing Provider  Oxcarbazepine (TRILEPTAL) 300 MG tablet Take 300 mg by mouth 2 (two) times daily.    [provider]  traZODone (DESYREL) 100  MG tablet Take 300 mg by mouth at bedtime.    [provider]  ziprasidone (GEODON) 40 MG capsule Take 40 mg by mouth 2 (two) times daily with a meal.    [provider]    Family History No family history on file.  Social History Social History   Tobacco Use  . Smoking status: Current Every Day Smoker    Packs/day: 1.00    Types: Cigarettes  . Smokeless tobacco: Never Used  Substance Use Topics  . Alcohol use: Yes    Comment: daily- asmuch as I can. I want to stay passed out.  . Drug use: Yes    Types: Cocaine    Comment: Pataient states he has been using as much crack as possible     Allergies   Sulfa antibiotics   Review of Systems Review of Systems  Constitutional: Positive for fever.  Gastrointestinal: Positive for abdominal pain, anorexia, diarrhea, nausea and vomiting. Negative for constipation.  Genitourinary: Negative for dysuria.  All other systems reviewed and are negative.    Physical Exam Updated Vital Signs BP 127/90 (BP Location: Right Arm)   Pulse (!) 107   Temp 98.7 F (37.1 C) (Oral)   Resp 16   SpO2 94%   Physical Exam  Constitutional: He is oriented to person, place, and time. He appears well-developed and well-nourished. No distress.  HENT:  Head: Normocephalic and  atraumatic.  Mouth/Throat: Oropharynx is clear and moist.  Eyes: Pupils are equal, round, and reactive to light. Conjunctivae and EOM are normal.  Neck: Normal range of motion. Neck supple.  Cardiovascular: Regular rhythm and intact distal pulses. Tachycardia present.  No murmur heard. Pulmonary/Chest: Effort normal. No respiratory distress. He has decreased breath sounds. He has wheezes. He has no rales. He exhibits tenderness.  Abdominal: Soft. He exhibits no distension. There is tenderness. There is no rebound and no guarding.  Mild diffuse tenderness  Musculoskeletal: Normal range of motion. He exhibits no edema or tenderness.  Neurological: He is alert  and oriented to person, place, and time.  Skin: Skin is warm and dry. No rash noted. No erythema.  Psychiatric: He has a normal mood and affect. His behavior is normal.  Nursing note and vitals reviewed.    ED Treatments / Results  Labs (all labs ordered are listed, but only abnormal results are displayed) Labs Reviewed  COMPREHENSIVE METABOLIC PANEL - Abnormal; Notable for the following components:      Result Value   Potassium 3.2 (*)    Chloride 91 (*)    AST 54 (*)    Anion gap 18 (*)    All other components within normal limits  CBC WITH DIFFERENTIAL/PLATELET - Abnormal; Notable for the following components:   WBC 11.6 (*)    Neutro Abs 7.9 (*)    All other components within normal limits  ETHANOL - Abnormal; Notable for the following components:   Alcohol, Ethyl (B) 295 (*)    All other components within normal limits  LIPASE, BLOOD - Abnormal; Notable for the following components:   Lipase 59 (*)    All other components within normal limits  I-STAT CG4 LACTIC ACID, ED - Abnormal; Notable for the following components:   Lactic Acid, Venous 2.40 (*)    All other components within normal limits  I-STAT TROPONIN, ED  I-STAT CG4 LACTIC ACID, ED    EKG EKG Interpretation  Date/Time:  Wednesday July 18 2018 21:27:21 EDT Ventricular Rate:  91 PR Interval:  144 QRS Duration: 94 QT Interval:  366 QTC Calculation: 450 R Axis:   79 Text Interpretation:  Normal sinus rhythm Normal ECG No previous tracing Confirmed by Blanchie Dessert [phone]) on 07/18/2018 9:36:08 PM   Radiology Dg Chest 2 View  Result Date: 07/18/2018 CLINICAL DATA:  Short of breath EXAM: CHEST - 2 VIEW COMPARISON:  None. FINDINGS: Hyperinflation. No focal opacity or pleural effusion. Normal heart size. No pneumothorax. Mild wedging at approximate T12 level. IMPRESSION: Hyperinflation without acute focal airspace disease. Electronically Signed   By: Donavan Foil M.D.   On: 07/18/2018 21:31     Procedures Procedures (including critical care time)  Medications Ordered in ED Medications  nicotine (NICODERM CQ - dosed in mg/24 hours) patch 21 mg (21 mg Transdermal Patch Applied 07/18/18 2135)  sodium chloride 0.9 % bolus 1,000 mL (1,000 mLs Intravenous New Bag/Given 07/18/18 2147)  0.9 %  sodium chloride infusion (has no administration in time range)  albuterol (PROVENTIL) (2.5 MG/3ML) 0.083% nebulizer solution 5 mg (has no administration in time range)  predniSONE (DELTASONE) tablet 60 mg (60 mg Oral Given 07/18/18 2147)  ondansetron (ZOFRAN) injection 4 mg (4 mg Intravenous Given 07/18/18 2146)  ipratropium (ATROVENT) nebulizer solution 0.5 mg (0.5 mg Nebulization Given 07/18/18 2154)     Initial Impression / Assessment and Plan / ED Course  I have reviewed the triage vital signs and the nursing notes.  Pertinent labs & imaging results that were available during my care of the patient were reviewed by me and considered in my medical decision making (see chart for details).     Patient presenting today with multiple complaints.  Initial complaint is of productive cough, flulike symptoms, shortness of breath it has been going on about a 1 week to 10 days.  Patient is wheezing on exam but oxygen saturation is 94% on room air.  Patient's chest x-ray is negative for pneumonia but concern for bronchitis with a history of the same.  Patient is a heavy smoker.  Patient given albuterol, Atrovent and prednisone.  From respiratory standpoint he is feeling much better.  Also patient states that he drinks a pint of liquor daily and has significantly decreased amount of alcohol use in the last few [place].  However he is not tremulous or displaying any signs concerning for DTs or alcohol withdrawal.  Patient also appears to be dehydrated with a lactic acid of 2.4.  Patient was given IV fluids.  Troponin and EKG without acute findings.  10:42 PM Labs show that patient's CMP with mild  hypokalemia of 3.2 but LFTs within normal limits.  He does have a mild anion gap of 18.  White count of 11 but normal hemoglobin.  Patient feels better after fluids.  He is eating and drinking here without difficulty.  Alcohol level is almost 300 and lipase of 59.  Patient feels like he is breathing much easier after albuterol and Atrovent.  Will discharge home with prednisone, albuterol and azithromycin.  Final Clinical Impressions(s) / ED Diagnoses   Final diagnoses:  Nausea vomiting and diarrhea  Bronchitis  Dehydration    ED Discharge Orders         Ordered    predniSONE (DELTASONE) 20 MG tablet  Daily     07/18/18 2241    azithromycin (ZITHROMAX) 250 MG tablet  Daily     07/18/18 2241           Blanchie Dessert, MD 07/18/18 2243

## 2018-07-23 ENCOUNTER — Emergency Department (HOSPITAL_COMMUNITY)
Admission: EM | Admit: 2018-07-23 | Discharge: 2018-07-23 | Disposition: A | Discharge status: Home / Self Care | Payer: Medicare Other | Source: Home / Self Care | Attending: Emergency Medicine | Admitting: Emergency Medicine

## 2018-07-23 ENCOUNTER — Encounter (HOSPITAL_COMMUNITY): Payer: Self-pay | Admitting: Emergency Medicine

## 2018-07-23 ENCOUNTER — Emergency Department (HOSPITAL_COMMUNITY): Payer: Medicare Other

## 2018-07-23 ENCOUNTER — Emergency Department (HOSPITAL_COMMUNITY)
Admission: EM | Admit: 2018-07-23 | Discharge: 2018-07-23 | Disposition: A | Discharge status: Home / Self Care | Payer: Medicare Other | Attending: Emergency Medicine | Admitting: Emergency Medicine

## 2018-07-23 ENCOUNTER — Other Ambulatory Visit: Payer: Self-pay

## 2018-07-23 ENCOUNTER — Encounter (HOSPITAL_COMMUNITY): Payer: Self-pay

## 2018-07-23 DIAGNOSIS — F1910 Other psychoactive substance abuse, uncomplicated: Secondary | ICD-10-CM

## 2018-07-23 DIAGNOSIS — F17210 Nicotine dependence, cigarettes, uncomplicated: Secondary | ICD-10-CM

## 2018-07-23 DIAGNOSIS — R252 Cramp and spasm: Secondary | ICD-10-CM

## 2018-07-23 DIAGNOSIS — J45909 Unspecified asthma, uncomplicated: Secondary | ICD-10-CM

## 2018-07-23 DIAGNOSIS — F10229 Alcohol dependence with intoxication, unspecified: Secondary | ICD-10-CM | POA: Diagnosis not present

## 2018-07-23 DIAGNOSIS — Z79899 Other long term (current) drug therapy: Secondary | ICD-10-CM

## 2018-07-23 DIAGNOSIS — R05 Cough: Secondary | ICD-10-CM

## 2018-07-23 DIAGNOSIS — F1010 Alcohol abuse, uncomplicated: Secondary | ICD-10-CM | POA: Insufficient documentation

## 2018-07-23 DIAGNOSIS — Z5321 Procedure and treatment not carried out due to patient leaving prior to being seen by health care provider: Secondary | ICD-10-CM | POA: Diagnosis not present

## 2018-07-23 LAB — CBC
HEMATOCRIT: 45.6 % (ref 39.0–52.0)
HEMOGLOBIN: 15.4 g/dL (ref 13.0–17.0)
MCH: 31.8 pg (ref 26.0–34.0)
MCHC: 33.8 g/dL (ref 30.0–36.0)
MCV: 94 fL (ref 80.0–100.0)
Platelets: 179 10*3/uL (ref 150–400)
RBC: 4.85 MIL/uL (ref 4.22–5.81)
RDW: 14.6 % (ref 11.5–15.5)
WBC: 4.6 10*3/uL (ref 4.0–10.5)
nRBC: 0 % (ref 0.0–0.2)

## 2018-07-23 LAB — BASIC METABOLIC PANEL
Anion gap: 15 (ref 5–15)
BUN: 12 mg/dL (ref 6–20)
CHLORIDE: 100 mmol/L (ref 98–111)
CO2: 22 mmol/L (ref 22–32)
Calcium: 8.1 mg/dL — ABNORMAL LOW (ref 8.9–10.3)
Creatinine, Ser: 0.67 mg/dL (ref 0.61–1.24)
GFR calc Af Amer: >60 mL/min (ref >60)
GFR calc non Af Amer: >60 mL/min (ref >60)
GLUCOSE: 115 mg/dL — AB (ref 70–99)
POTASSIUM: 3.6 mmol/L (ref 3.5–5.1)
Sodium: 137 mmol/L (ref 135–145)

## 2018-07-23 MED ORDER — OXCARBAZEPINE 300 MG PO TABS: 300.0000 mg | ORAL_TABLET | Freq: Two times a day (BID) | ORAL | 0 refills | Status: AC

## 2018-07-23 MED ORDER — ZIPRASIDONE HCL 40 MG PO CAPS: 40.0000 mg | ORAL_CAPSULE | Freq: Two times a day (BID) | ORAL | 0 refills | Status: AC

## 2018-07-23 MED ORDER — TRAZODONE HCL 100 MG PO TABS: 300.0000 mg | ORAL_TABLET | Freq: Every day | ORAL | 0 refills | Status: AC

## 2018-07-23 NOTE — ED Triage Notes (Signed)
EMS reports Pt states he has been on a 2 Shiffer binge in a hotel with alcohol abuse, methamphetamine and crack use. Pt states he was brought drugs by "People at the [facility]" and did whatever he was given. Pt states he is an alcoholic and wants to be treated for abuse and detox.  BP 140/80 HR 86 RR 22 Sp02 95 RA CBG 147

## 2018-07-23 NOTE — ED Notes (Signed)
Awaiting peer support

## 2018-07-23 NOTE — Patient Outreach (Signed)
ED Peer Support Specialist Patient Intake (Complete at intake & 30-60 Day Follow-up)  Name: Ronald Dickson  MRN: [id number]  Age: 42 y.o.   Date of Admission: 07/23/2018  Intake: Initial Comments:      Primary Reason Admitted: poly substance use with cocaine, meth, and alcohol  Lab values: Alcohol/ETOH: Not completed Positive UDS? Drug screen not completed Amphetamines: Drug screen not completed Barbiturates: Drug screen not completed Benzodiazepines: Drug screen not completed Cocaine: Drug screen not completed Opiates: Drug screen not completed Cannabinoids: Drug screen not completed  Demographic information: Gender: Male Ethnicity: White Marital Status: Divorced Insurance Status: Medicare(Medicare Part A and B) Ecologist (Work Neurosurgeon, Physicist, medical, Social research officer, government.: Yes(Disability, food stamps) Lives with: Alone Living situation: Other (comment)([employer])  Reported Patient History: Patient reported health conditions: Bipolar disorder, Asthma Patient aware of HIV and hepatitis status: Yes (comment)(Negative)  In past year, has patient visited ED for any reason? Yes  Number of ED visits: 5  Reason(s) for visit: alcohol use   In past year, has patient been hospitalized for any reason? No  Number of hospitalizations:    Reason(s) for hospitalization:    In past year, has patient been arrested? No  Number of arrests:    Reason(s) for arrest:    In past year, has patient been incarcerated? No  Number of incarcerations:    Reason(s) for incarceration:    In past year, has patient received medication-assisted treatment? No  In past year, patient received the following treatments:    In past year, has patient received any harm reduction services? No  Did this include any of the following?    In past year, has patient received care from a mental health provider for diagnosis other than SUD? No  In past year, is this first time patient has  overdosed? (has not overdosed)  Number of past overdoses:    In past year, is this first time patient has been hospitalized for an overdose? (has not overdosed)  Number of hospitalizations for overdose(s):    Is patient currently receiving treatment for a mental health diagnosis? No  Patient reports experiencing difficulty participating in SUD treatment: No    Most important reason(s) for this difficulty?    Has patient received prior services for treatment? No  In past, patient has received services from following agencies:    Plan of Care:  Suggested follow up at these agencies/treatment centers: (Patient is interested in detox resources. CPSS will provide information for these substance use treatment resources. )  Other information: CPSS met with the patient to provide substance use recovery support and help with recovery resources. Patient's friend is coming to pick up patient after discharge from [facility]. CPSS will provide information for outpatient/residential substance use treatment center list, detox resource center list, AA/NA meeting list, and CPSS contact information.     Mason Jim, CPSS  07/23/2018 6:10 PM

## 2018-07-23 NOTE — ED Notes (Signed)
Patient transported to X-ray 

## 2018-07-23 NOTE — Discharge Instructions (Signed)
It was our pleasure to provide your ER care today - we hope that you feel better.  Avoid alcohol and/or drug use - follow up with AA, and use resource guide provided.  Also follow up with primary care doctor and therapist/counselor in the the next 1-2 [place].  Return to ER if worse, new symptoms, fevers, trouble breathing, other concern.

## 2018-07-23 NOTE — ED Provider Notes (Signed)
[facility] DEPT Provider Note   CSN: [id number] Arrival date & time: 07/23/18  1223     History   Chief Complaint Chief Complaint  Patient presents with  . detox    HPI Ronald Dickson is a 42 y.o. male.  Patient c/o etoh abuse, wanting referral to rehab, as well as non productive cough in the past couple [place]. Symptoms moderate, persistent, episodic.  Pt also notes intermittent muscle cramping, diffuse. Pt with hx chronic etoh abuse, states is from [place] and just visiting area. States when stops drinks feels shaky, denies hx seizures, dts or complicated etoh withdrawal. Pt also notes cocaine and meth abuse. Pt last drank this AM. Denies depression or SI, but states out of his 'bipolar meds' for several [place].  The history is provided by the patient.    Past Medical History:  Diagnosis Date  . Alcohol abuse   . Asthma   . Bipolar 1 disorder ([place])   . Cancer ([place])   . Seizures [facility])     Patient Active Problem List   Diagnosis Date Noted  . Alcohol abuse with alcohol-induced mood disorder ([place]) 06/16/2018    Past Surgical History:  Procedure Laterality Date  . ANKLE FRACTURE SURGERY    . NOSE SURGERY          Home Medications    Prior to Admission medications   Medication Sig Start Date End Date Taking? Authorizing Provider  azithromycin (ZITHROMAX) 250 MG tablet Take 1 tablet (250 mg total) by mouth daily. Take first 2 tablets together, then 1 every day until finished. 07/18/18   Blanchie Dessert, MD  Oxcarbazepine (TRILEPTAL) 300 MG tablet Take 300 mg by mouth 2 (two) times daily.    [provider]  predniSONE (DELTASONE) 20 MG tablet Take 2 tablets (40 mg total) by mouth daily. 07/18/18   Blanchie Dessert, MD  traZODone (DESYREL) 100 MG tablet Take 300 mg by mouth at bedtime.    [provider]  ziprasidone (GEODON) 40 MG capsule Take 40 mg by mouth 2 (two) times daily with a meal.    [provider]     Family History No family history on file.  Social History Social History   Tobacco Use  . Smoking status: Current Every Day Smoker    Packs/day: 1.00    Types: Cigarettes  . Smokeless tobacco: Never Used  Substance Use Topics  . Alcohol use: Yes    Comment: daily- asmuch as I can. I want to stay passed out.  . Drug use: Yes    Types: Cocaine    Comment: Pataient states he has been using as much crack as possible     Allergies   Sulfa antibiotics   Review of Systems Review of Systems  Constitutional: Negative for fever.  HENT: Negative for sore throat.   Eyes: Negative for redness.  Respiratory: Negative for shortness of breath.   Cardiovascular: Negative for chest pain.  Gastrointestinal: Negative for abdominal pain, diarrhea and vomiting.  Genitourinary: Negative for flank pain.  Musculoskeletal: Negative for neck pain and neck stiffness.  Skin: Negative for rash.  Neurological: Negative for headaches.  Hematological: Does not bruise/bleed easily.  Psychiatric/Behavioral: Negative for confusion and suicidal ideas.     Physical Exam Updated Vital Signs BP 109/72 (BP Location: Left Arm)   Pulse 96   Temp 97.8 F (36.6 C)   Resp 17   SpO2 98%   Physical Exam  Constitutional: He is oriented to person,  place, and time. He appears well-developed and well-nourished.  HENT:  Head: Atraumatic.  Mouth/Throat: Oropharynx is clear and moist.  Eyes: Pupils are equal, round, and reactive to light. Conjunctivae are normal.  Neck: Neck supple. No tracheal deviation present.  Cardiovascular: Normal rate, regular rhythm, normal heart sounds and intact distal pulses. Exam reveals no gallop and no friction rub.  No murmur heard. Pulmonary/Chest: Effort normal and breath sounds normal. No accessory muscle usage. No respiratory distress.  Abdominal: Soft. Bowel sounds are normal. He exhibits no distension. There is no tenderness.  Musculoskeletal: He exhibits no edema or  tenderness.  No focal bony tenderness. Distal pulses palp bil.   Neurological: He is alert and oriented to person, place, and time.  Speech clear/fluent. Steady gait.   Skin: Skin is warm and dry. No rash noted.  Psychiatric: He has a normal mood and affect.  No thoughts of harm to self or others.   Nursing note and vitals reviewed.    ED Treatments / Results  Labs (all labs ordered are listed, but only abnormal results are displayed) Results for orders placed or performed during the hospital encounter of 52/84/13  Basic metabolic panel  Result Value Ref Range   Sodium 137 135 - 145 mmol/L   Potassium 3.6 3.5 - 5.1 mmol/L   Chloride 100 98 - 111 mmol/L   CO2 22 22 - 32 mmol/L   Glucose, Bld 115 (H) 70 - 99 mg/dL   BUN 12 6 - 20 mg/dL   Creatinine, Ser 0.67 0.61 - 1.24 mg/dL   Calcium 8.1 (L) 8.9 - 10.3 mg/dL   GFR calc non Af Amer >60 >60 mL/min   GFR calc Af Amer >60 >60 mL/min   Anion gap 15 5 - 15  CBC  Result Value Ref Range   WBC 4.6 4.0 - 10.5 K/uL   RBC 4.85 4.22 - 5.81 MIL/uL   Hemoglobin 15.4 13.0 - 17.0 g/dL   HCT 45.6 39.0 - 52.0 %   MCV 94.0 80.0 - 100.0 fL   MCH 31.8 26.0 - 34.0 pg   MCHC 33.8 30.0 - 36.0 g/dL   RDW 14.6 11.5 - 15.5 %   Platelets 179 150 - 400 K/uL   nRBC 0.0 0.0 - 0.2 %   Dg Chest 2 View  Result Date: 07/23/2018 CLINICAL DATA:  42 y/o  M; 1 week off. EXAM: CHEST - 2 VIEW COMPARISON:  07/18/2018 chest radiograph FINDINGS: Stable heart size and mediastinal contours are within normal limits. Both lungs are clear. The visualized skeletal structures are unremarkable. IMPRESSION: No acute pulmonary process identified. Electronically Signed   By: Kristine Garbe M.D.   On: 07/23/2018 16:15   Dg Chest 2 View  Result Date: 07/18/2018 CLINICAL DATA:  Short of breath EXAM: CHEST - 2 VIEW COMPARISON:  None. FINDINGS: Hyperinflation. No focal opacity or pleural effusion. Normal heart size. No pneumothorax. Mild wedging at approximate T12  level. IMPRESSION: Hyperinflation without acute focal airspace disease. Electronically Signed   By: Donavan Foil M.D.   On: 07/18/2018 21:31    EKG None  Radiology No results found.  Procedures Procedures (including critical care time)  Medications Ordered in ED Medications - No data to display   Initial Impression / Assessment and Plan / ED Course  I have reviewed the triage vital signs and the nursing notes.  Pertinent labs & imaging results that were available during my care of the patient were reviewed by me and considered in my  medical decision making (see chart for details).  Labs. Cxr.  Reviewed nursing notes and prior charts for additional history.   Pt asking for food/drink - provided.   Labs reviewed - chem normal.   Patient has eaten/drank, normal appetite.   Discussed with [facility] team - they recommend outpt consult to Peer support services - provided.   Pt with normal mood/affect. No tremor or shakes. Clinically sober.   Pt currently appears stable for d/c.  Pt requests rx for his normal meds.     Final Clinical Impressions(s) / ED Diagnoses   Final diagnoses:  None    ED Discharge Orders    None       Lajean Saver, MD 07/23/18 1726

## 2018-07-23 NOTE — ED Triage Notes (Signed)
Pt wanting detox ETOH. Reports that been on a big binger for [place]. While he was drunk at [employer] [address] he was staying at people came by and he used crack and meth. Last drink was last night Pt c/o bilat lower leg cramping esp at night for 2 days.

## 2019-03-02 IMAGING — CR DG CHEST 2V
2 series · 2 of 2 positions shown · non-contrast
Comparison: 07/18/2018 chest radiograph

CLINICAL DATA: 42 y/o  M; 1 week off.

EXAM:
CHEST - 2 VIEW

[w chest pa]
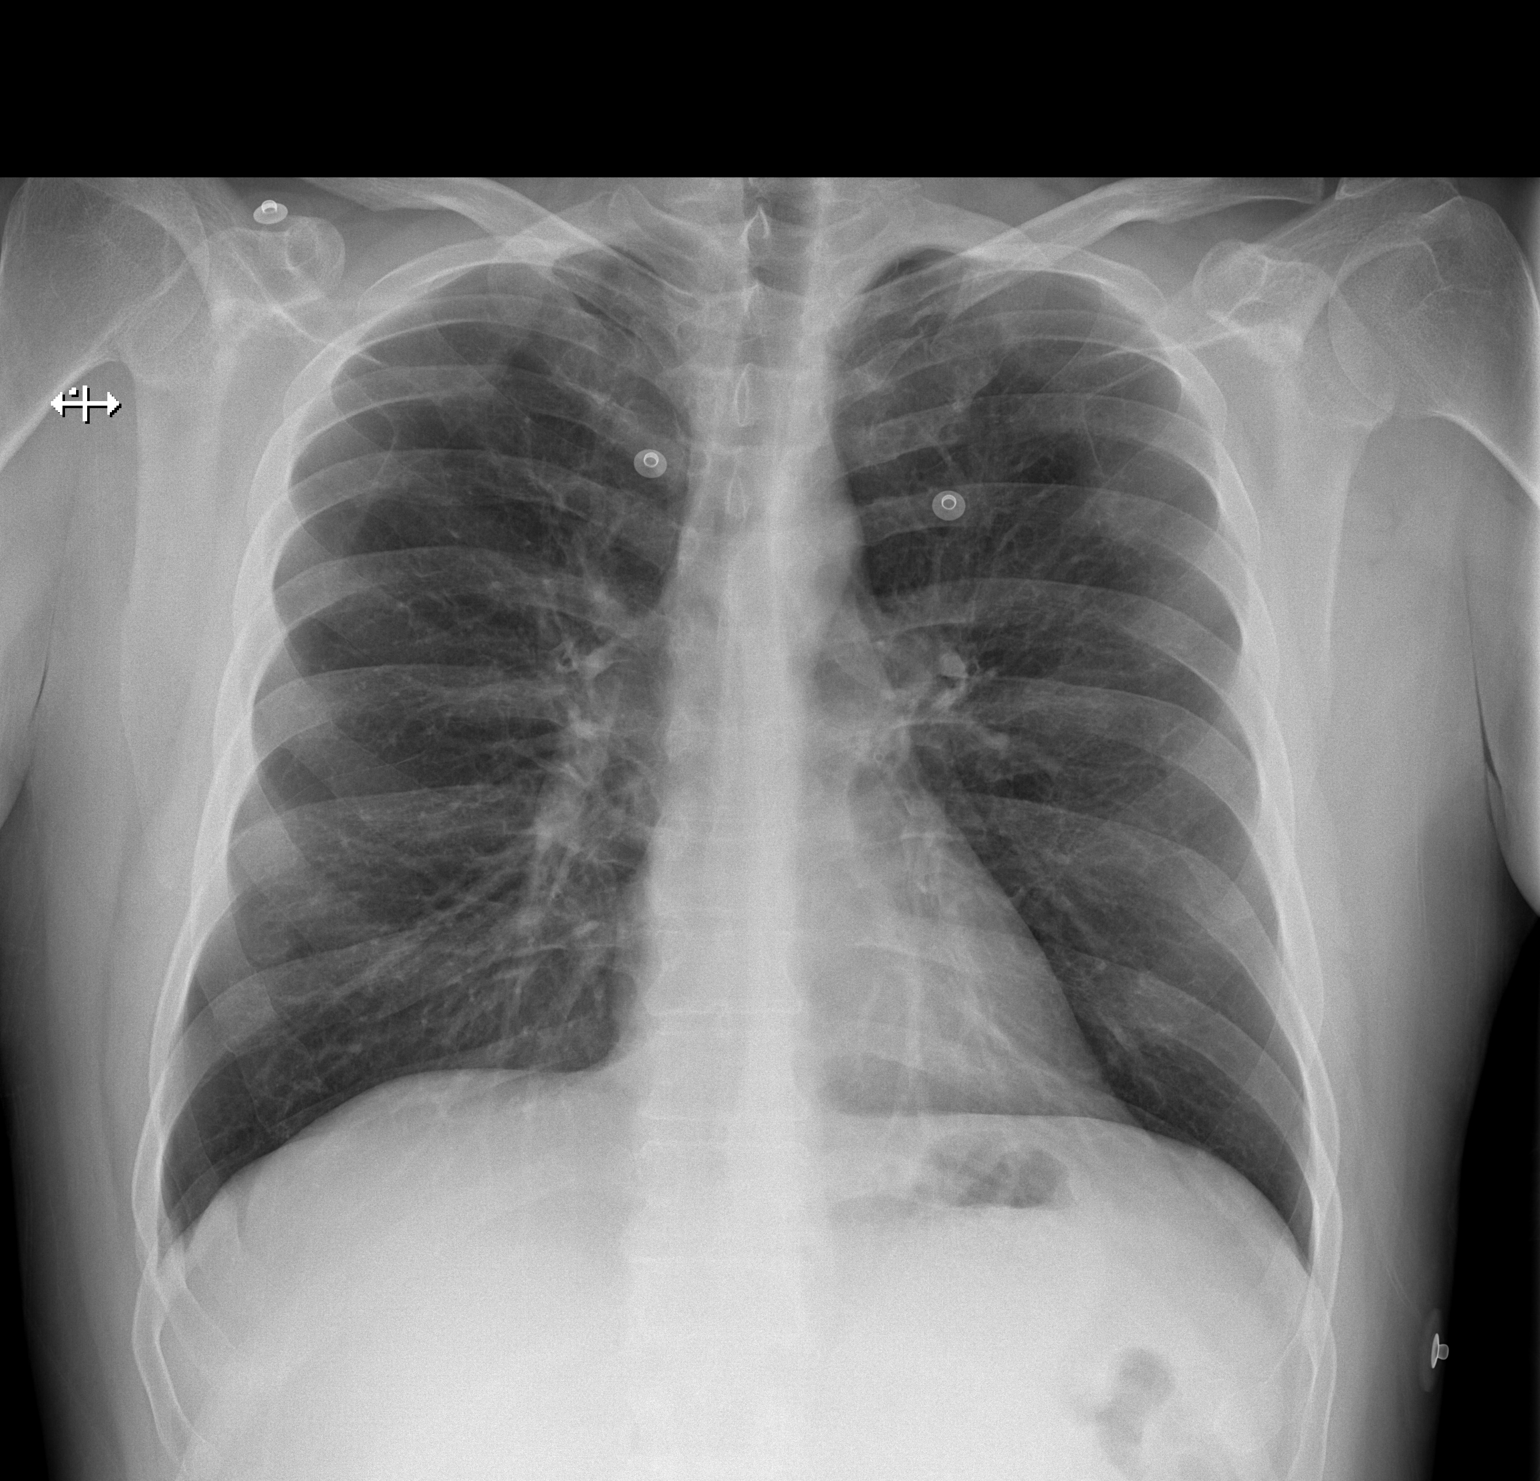

[w chest lat]
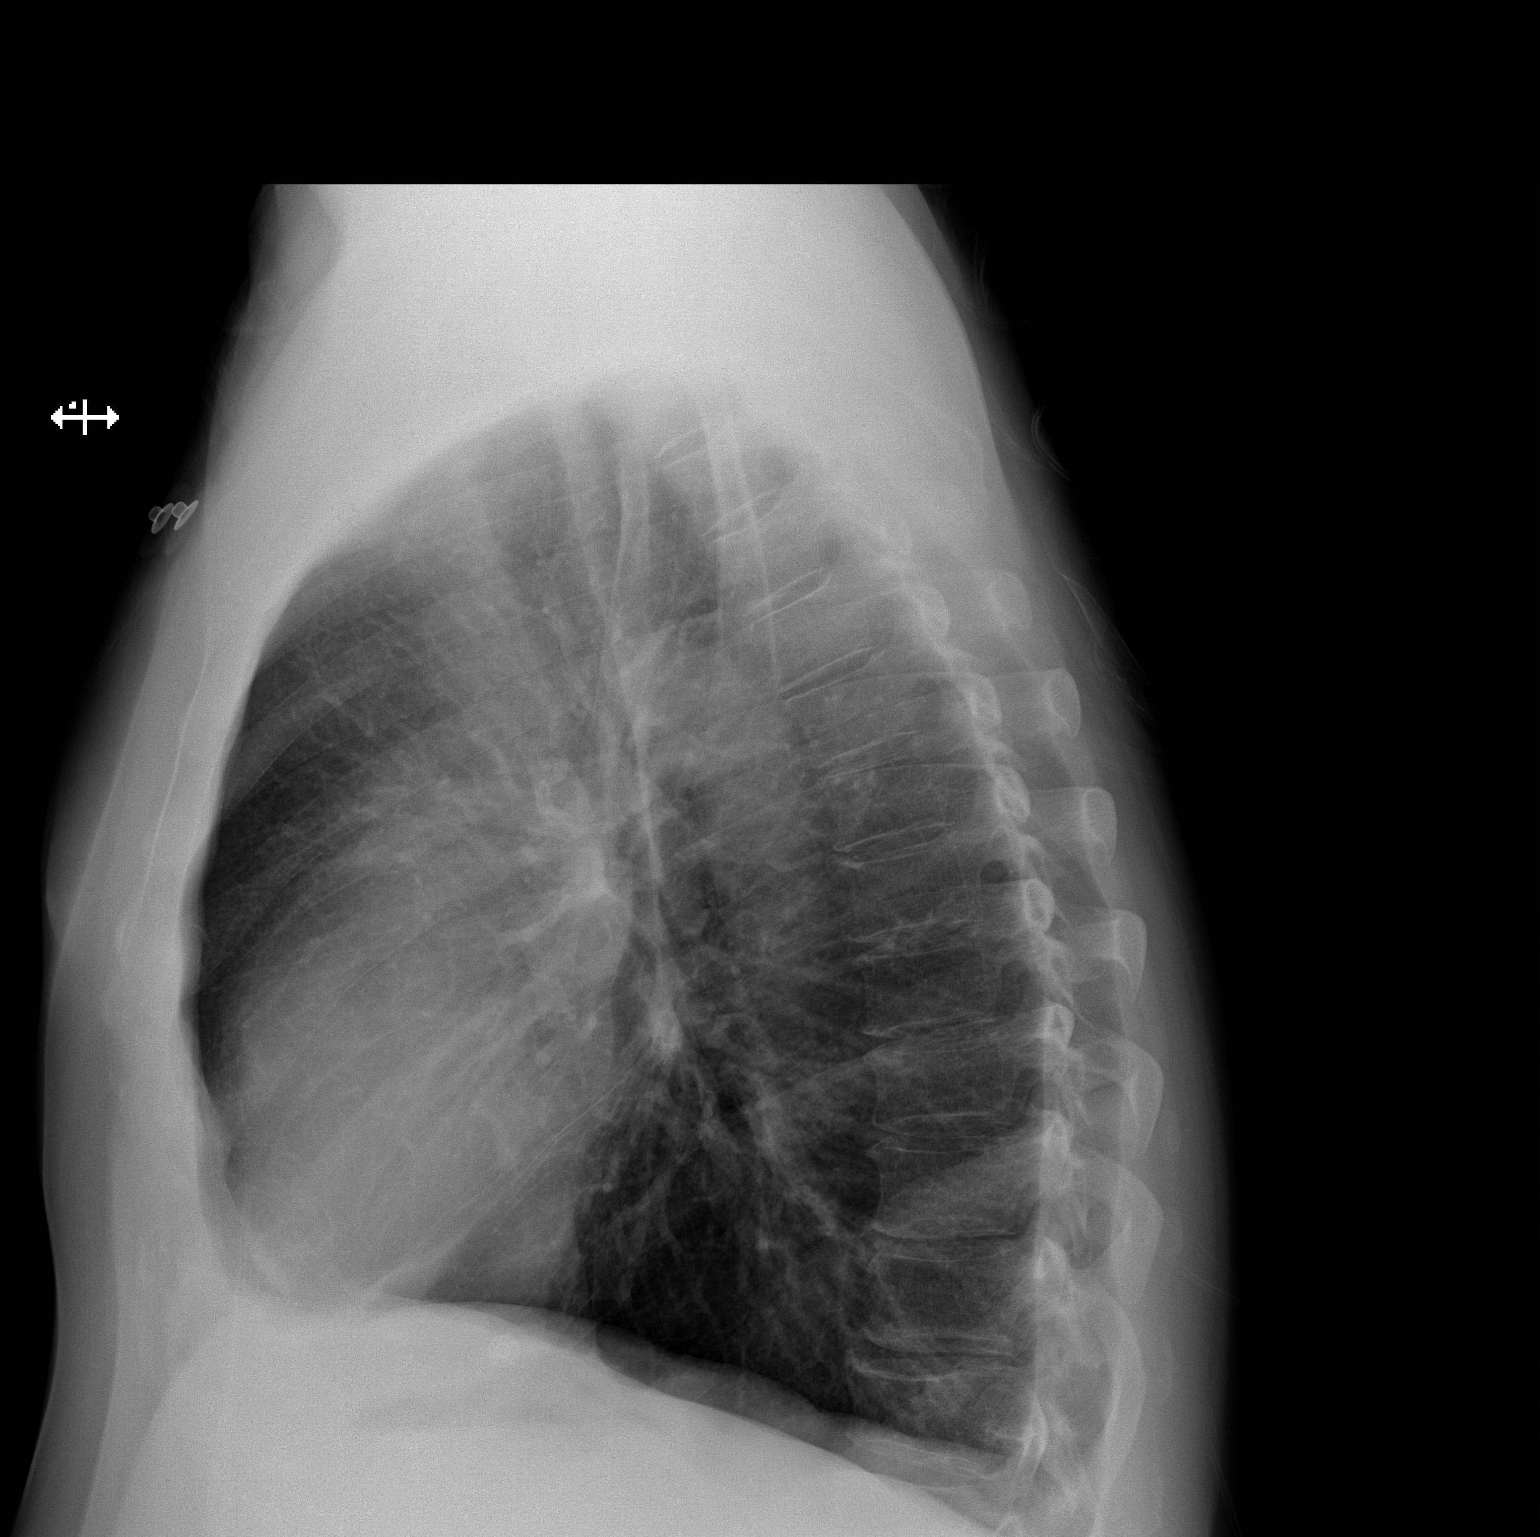

[2 of 2 positions shown; findings below may reference images not displayed]

FINDINGS: Stable heart size and mediastinal contours are within normal limits.
Both lungs are clear. The visualized skeletal structures are
unremarkable.
IMPRESSION: No acute pulmonary process identified.

By: Wendee Pabon M.D.
